# Patient Record
Sex: Male | Born: 1990
Health system: Western US, Academic
[De-identification: ages and names within clinical notes are randomized; demographics above are authoritative.]

## PROBLEM LIST (undated history)

## (undated) DEATH — deceased

---
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
## 2017-05-18 ENCOUNTER — Encounter (HOSPITAL_BASED_OUTPATIENT_CLINIC_OR_DEPARTMENT_OTHER): Payer: Self-pay | Admitting: Sports Medicine

## 2017-05-18 NOTE — Telephone Encounter (Signed)
Open in error

## 2017-06-11 ENCOUNTER — Telehealth (HOSPITAL_BASED_OUTPATIENT_CLINIC_OR_DEPARTMENT_OTHER): Payer: Self-pay

## 2017-06-11 NOTE — Telephone Encounter (Signed)
(  TEXTING IS AN OPTION FOR [facility] CLINICS ONLY)  Is this a [facility] clinic? No      RETURN CALL: Detailed message on voicemail only      SUBJECT:  General Message     REASON FOR REQUEST: Medical Records sent    MESSAGE: patient states that the medical records had been sent over on 6/7 or 6/8 and was checking to make sure that they were received so he could schedule an appt. Please contact patient to advise. Thank you.

## 2017-06-13 NOTE — Telephone Encounter (Signed)
Checked all pending referrals. No referral, records, or imaging received for patient.    Placed outgoing call to [phone]: call rang then dropped. Tried again, went to voicemail after 2 rings.    Left message: Advised patient nothing received yet. Confirmed fax number. Advised we sent records request back in April. Advised records/imaging required for review due to distance traveled. Provided direct phone number twice.    Regards,    Maryella ShiversSarah Masters  Patient Services Specialist  [facility]

## 2017-06-22 ENCOUNTER — Ambulatory Visit: Attending: Sports Medicine | Admitting: Sports Medicine

## 2017-06-22 ENCOUNTER — Encounter (HOSPITAL_BASED_OUTPATIENT_CLINIC_OR_DEPARTMENT_OTHER): Payer: Self-pay | Admitting: Sports Medicine

## 2017-06-22 VITALS — BP 130/82 | HR 64 | Ht 70.3 in | Wt 194.6 lb

## 2017-06-22 DIAGNOSIS — M545 Low back pain, unspecified: Secondary | ICD-10-CM

## 2017-06-22 DIAGNOSIS — Z6827 Body mass index (BMI) 27.0-27.9, adult: Secondary | ICD-10-CM

## 2017-06-22 DIAGNOSIS — G8929 Other chronic pain: Secondary | ICD-10-CM | POA: Insufficient documentation

## 2017-06-22 DIAGNOSIS — M7918 Myalgia, other site: Secondary | ICD-10-CM

## 2017-06-22 MED ORDER — MELOXICAM 7.5 MG OR TABS
7.5000 mg | ORAL_TABLET | Freq: Every morning | ORAL | 1 refills | Status: DC
Start: 2017-06-22 — End: 2017-08-30

## 2017-06-22 NOTE — Patient Instructions (Addendum)
[facility] to [facility] today; Elvera MariaBrian J Solaris Kram MD appreciates the opportunity to work with you, your primary care provider and the rest of your health care team to work to optimize your function and quality of life.    Our goal is to provide easy to understand instructions.    Reminders from your visit today:    Diagnosis:  1. Left rhomboid myofascial pain  2. Right lower back/gluteal pain, piriformis syndrome vs discogenic pain    Recommendations:    Left rhomboid myofascial pain  -massage and acupuncture therapy  -consider trigger point injection with Dr Lenon CurtKrabak, if no improvement  -continue to work on flexibility    Right lower back/gluteal pain, piriformis syndrome vs discogenic pain  -massage therapy  -focus on stretching, consider HOT YOGA  -utilize Meloxicam 7.5 mg once a day, especially one flight days  -consider MRI of the Lumbar Spine  -consider US Guided Piriformis Injection for the gluteal pain    FOLLOW-UP: Schedule an appointment for 8 weeks to review your program    Contact Dr Lenon CurtKrabak through [facility] to review your progress and any images, as appropriate    Have other questions? - Just let us know, we'd be happy to help. Please feel free to call us at [phone] Option 2 or use the "email" feature in [facility].    You may receive a survey in the mail about your experience in our clinic today and about the care by team and I provided for you.  Your feedback is very important to me.  Please take a few moments to complete and return the survey.

## 2017-06-22 NOTE — Progress Notes (Addendum)
[facility] at [facility]  [address] [place], [place]  [address]  TEL: [phone]    FAX: [phone]  [email or web address]    Specializing in sports-related injuries and non-surgical care for conditions of the spine,  shoulder, elbow, wrist, hand, hip, knee, foot, and ankle.        06/22/2017      Randy Duarte  [id number]      REASON FOR VISIT/CONSULTATION:  Leg Pain (Right glute pain radiating to hamstring; began about 1 year ago; pain on the right leg x3 months) and Back Pain (Left upper back pain/out of place; x1 year)      HISTORY OF PRESENT ILLNESS:  Randy Duarte is a 27 year old right-hand dominant male being seen in consultation at the request of Doretha Imus, Leticia Clas for the evaluation of their chief complaint.  About 1-2 years ago, he was flying and started experience the acute onset of left side lateral hip that worsened with internal rotation of left hip. He had some numbness in the left big toe. This pain resolved with PT. About 4 months, he reports the onset of right sided gluteal pain with radiation towards the posterior hamstring especially with prolonged sitting or flying (typically 1-2 hours). He doesn't note numbness in the right foot. The pain is described as a "nerve like pain" that can get up to a 7/10. He notes pain with bending forward. He doesn't report weakness in the leg. Work-up has included x-rays of the back and bone scan. Treatment has included activity modifications and PT. He does report occasional intermittent lower back pain and stiffness with walking long distance or hiking.     The second concern that involves left upper thoracic spine pain that is is achy to sharp in quality. He rates the pain as 7 out of 10. There is no radiation or numbness in the extremity. Symptoms improve with chiropractic therapy. Work-up has included x-rays and bone scan. Treatment has included chiropractic therapy and PT.     PAST MEDICAL  HISTORY:  He  has a past medical history of NEGATIVE PAST MEDICAL HISTORY OF.    PAST SURGICAL HISTORY:   He  has a past surgical history that includes UNLISTED PROCEDURE SHOULDER (Right, 01/2007).    ALLERGIES:   Review of patient's allergies indicates:  Allergies   Allergen Reactions    Ceclor [Cefaclor] Unknown     As a child.       MEDICATIONS:   Current Outpatient Medications   Medication Sig Dispense Refill    Loratadine 10 MG Oral Tab Take by mouth.       No current facility-administered medications for this visit.        FAMILY HISTORY:   Pertinent family history includes Hypertension in his father; No Known Problems in his mother.    SOCIAL HISTORY:  He  reports that he has never smoked. He has never used smokeless tobacco. He reports that he drinks alcohol. He reports that he does not use drugs.    REVIEW OF SYSTEMS:  Except for those mentioned in the HPI, complete review of systems is negative.    PHYSICAL EXAMINATION:  VITAL SIGNS: BP 130/82    Pulse 64    Ht 5' 10.3" (1.786 m)    Wt 194 lb 9.6 oz (88.3 kg)    BMI 27.68 kg/m  Exercise Vitals: Total Minutes of Exercise per Week: (!) 120.  GENERAL: He is Well-Developed and  Well Nourished. They are alert and oriented.  PSYCHOLOGICAL:  No acute distress. Mood euthymic. Normal affect.  HEENT: Normal cephalic, atraumatic. Non-interic sclera. Moist mucous membranes   RESPIRATORY:  Non-labored breathing  CARDIOVASCULAR: No cyanosis, clubbing or edema. Intact peripheral pulses.  SKIN: No atrophy, rashes or open wounds involving the upper and lower extremities. NEUROLOGIC: CN II-XII grossly intact. Strength is grossly 5 out of 5 throughout bilateral upper and lower extremities. Sensation was intact in the bilateral upper and lower extremities.  Reflexes were within normal limits in the bilateral upper and lower extremities. MUSCULOSKELETAL: Inspection: Normal spinal alignment, muscle bulk, and pelvic symmetry. Range of Motion:  Cervical and Lumbar spine: Mildly  decreased in all planes. Hip: internal and external rotation intact bilaterally. Palpation:  There is tenderness to palpation at the left rhomboid and right gluteal regions. Special Tests:  Mildly positive slump test but negative Straight Leg Raise on the affected side. Negative FABERs test bilaterally. Negative impingement signs  Functional: Gait is non-antalgic. Core strength is abnormal.      IMAGING:   1. Xrays of the Thoracic Spine (03/14/2017): Normal  2. Xrays of the Cervical Spine (03/14/2017): Normal  3. Bone Scan (03/20/2017): Reported normal  4. MRI of the Lumbar Spine (10/23/2016): Reported normal in the notes    IMPRESSION:  1. Chronic left posterior thoracic pain, rhomboid myofascial pain  2. Chronic right lower back and gluteal pain, piriformis syndrome vs discitis    DISCUSSION/PLAN:  Left rhomboid myofascial pain  -massage and acupuncture therapy  -consider trigger point injection with Dr Lenon CurtKrabak, if no improvement  -continue to work on flexibility    Right lower back/gluteal pain, piriformis syndrome vs discogenic pain  -massage therapy  -focus on stretching, consider HOT YOGA  -utilize Meloxicam 7.5 mg once a day, especially one flight days  -consider MRI of the Lumbar Spine  -consider US Guided Piriformis Injection for the gluteal pain    FOLLOW-UP: Schedule an appointment for 8 weeks to review your program

## 2017-08-03 ENCOUNTER — Encounter (HOSPITAL_BASED_OUTPATIENT_CLINIC_OR_DEPARTMENT_OTHER): Payer: Self-pay | Admitting: Sports Medicine

## 2017-08-03 ENCOUNTER — Ambulatory Visit: Attending: Sports Medicine | Admitting: Sports Medicine

## 2017-08-03 VITALS — BP 137/79 | HR 65 | Ht 70.32 in | Wt 190.0 lb

## 2017-08-03 DIAGNOSIS — M545 Low back pain, unspecified: Secondary | ICD-10-CM

## 2017-08-03 DIAGNOSIS — G8929 Other chronic pain: Secondary | ICD-10-CM | POA: Insufficient documentation

## 2017-08-03 DIAGNOSIS — Z6827 Body mass index (BMI) 27.0-27.9, adult: Secondary | ICD-10-CM

## 2017-08-03 DIAGNOSIS — M7918 Myalgia, other site: Secondary | ICD-10-CM

## 2017-08-03 NOTE — Patient Instructions (Signed)
Thank you for coming to [facility] today; Randy MariaBrian J Karn Derk MD appreciates the opportunity to work with you, your primary care provider and the rest of your health care team to work to optimize your function and quality of life.    Our goal is to provide easy to understand instructions.    Reminders from your visit today:    Diagnosis:  1. Chronic left posterior thoracic pain, rhomboid myofascial pain  2. Chronic right lower back and gluteal pain, piriformis syndrome    Recommendations:    ACTIVITY: As tolerated    PHYSICAL THERAPY: Continue with PT and dry needling    INJECTION: US guided right piriformis steroid injection    FOLLOW-UP: Schedule for injecton if no improvement      Contact Dr Lenon CurtKrabak through e[facility] to review your progress and any images, as appropriate    Have other questions? - Just let us know, we'd be happy to help. Please feel free to call us at [phone] Option 2 or use the "email" feature in [facility].    You may receive a survey in the mail about your experience in our clinic today and about the care by team and I provided for you.  Your feedback is very important to me.  Please take a few moments to complete and return the survey.

## 2017-08-03 NOTE — Progress Notes (Signed)
[facility]  [address] [place], [place]  [address]  TEL: [phone]    FAX: [phone]  [email or web address]    Specializing in sports-related injuries and non-surgical care for conditions of the spine,  shoulder, elbow, wrist, hand, hip, knee, foot, and ankle.      REASON FOR VISIT/CONSULTATION:  Follow-Up  and Back Pain ("Chronic right-sided low back pain without sciatica"; Per last office visit note, consider Trigger Point Injections for rhomboid pain; Consider MRI lumbar spine versus ultrasound guided piriformis injection; Better when patient doesn't exercise, especialy with lower bosy; Hot yoga, stretches, and a couple of runs have been done; Recent plane trip has worsened pain in right-sided lower back; GOAL: "Expand what he thinks it is".)      HISTORY OF PRESENT ILLNESS (06/22/2017):  Randy Duarte is a 27 year old right-hand dominant male being seen in consultation at the request of Doretha Imus Leticia Clas for the evaluation of their chief complaint.  About 1-2 years ago, he was flying and started experience the acute onset of left side lateral hip that worsened with internal rotation of left hip. He had some numbness in the left big toe. This pain resolved with PT. About 4 months, he reports the onset of right sided gluteal pain with radiation towards the posterior hamstring especially with prolonged sitting or flying (typically 1-2 hours). He doesn't note numbness in the right foot. The pain is described as a "nerve like pain" that can get up to a 7/10. He notes pain with bending forward. He doesn't report weakness in the leg. Work-up has included x-rays of the back and bone scan. Treatment has included activity modifications and PT. He does report occasional intermittent lower back pain and stiffness with walking long distance or hiking.     The second concern that involves left upper thoracic spine pain that is is achy to sharp in  quality. He rates the pain as 7 out of 10. There is no radiation or numbness in the extremity. Symptoms improve with chiropractic therapy. Work-up has included x-rays and bone scan. Treatment has included chiropractic therapy and PT.     INTERVAL HISTORY:   Since his last examination, the patient reports slight improvement in his symptoms.  He still has intermittent pain involving the right-sided lower back with radiation to the thigh and rhomboid region.  Pain is described as intermittent and aching quality.  He rates the pain as a 3 out of 10 though it can go up to a 5 out of 10.  Symptoms are worse with prolonged sitting or prolonged stretching.  Symptoms improve with walking and standing.  He has received some dry needling to the two areas of pain.     PAST MEDICAL HISTORY:  He  has a past medical history of NEGATIVE PAST MEDICAL HISTORY OF.    PAST SURGICAL HISTORY:   He  has a past surgical history that includes UNLISTED PROCEDURE SHOULDER (Right, 01/2007).    ALLERGIES:   Review of patient's allergies indicates:  Allergies   Allergen Reactions    Ceclor [Cefaclor] Unknown     As a child.       MEDICATIONS:   Current Outpatient Medications   Medication Sig Dispense Refill    Loratadine 10 MG Oral Tab Take by mouth.      meloxicam 7.5 MG Oral Tab Take 1 tablet (7.5 mg) by mouth every morning. (Patient not taking: Reported on 08/03/2017) 30  tablet 1     No current facility-administered medications for this visit.        FAMILY HISTORY:   Pertinent family history includes Hypertension in his father; No Known Problems in his mother.    SOCIAL HISTORY:  He  reports that he has never smoked. He has never used smokeless tobacco. He reports that he drinks alcohol. He reports that he does not use drugs.    REVIEW OF SYSTEMS:  Except for those mentioned in the HPI, complete review of systems is negative.    PHYSICAL EXAMINATION:  VITAL SIGNS: BP 137/79    Pulse 65    Ht 5' 10.32" (1.786 m)    Wt 190 lb (86.2 kg)     SpO2 98%    BMI 27.02 kg/m  Exercise Vitals: Total Minutes of Exercise per Week: 250.  GENERAL: He is Well-Developed and Well Nourished. They are alert and oriented.  PSYCHOLOGICAL:  No acute distress. Mood euthymic. Normal affect.  HEENT: Normal cephalic, atraumatic. Non-interic sclera. Moist mucous membranes   RESPIRATORY:  Non-labored breathing  CARDIOVASCULAR: No cyanosis, clubbing or edema. Intact peripheral pulses.  SKIN: No atrophy, rashes or open wounds involving the upper and lower extremities. MUSCULOSKELETAL: Inspection: Normal spinal alignment, muscle bulk, and pelvic symmetry. Palpation:  There is tenderness to palpation at the left rhomboid and right gluteal regions. Special Tests:  Mildly positive slump test but negative Straight Leg Raise on the affected side.    IMAGING:   1. Xrays of the Thoracic Spine (03/14/2017): Normal  2. Xrays of the Cervical Spine (03/14/2017): Normal  3. Bone Scan (03/20/2017): Reported normal  4. MRI of the Lumbar Spine (10/23/2016): Reported normal in the notes    IMPRESSION:  1. Chronic left posterior thoracic pain, rhomboid myofascial pain  2. Chronic right lower back and gluteal pain, piriformis syndrome vs discitis    DISCUSSION/PLAN:  He is starting to experience a lot of improvement.  I'm recommending continued trial of dry needling and physical therapy.  If symptoms do not improve then I'll consider ultrasound guided piriformis steroid injection to the right gluteal region and /or trigger point injection to left rhomboid.    FOLLOW-UP: Schedule an appointment for 4-6 weeks

## 2017-08-28 ENCOUNTER — Other Ambulatory Visit (HOSPITAL_BASED_OUTPATIENT_CLINIC_OR_DEPARTMENT_OTHER): Admitting: Sports Medicine

## 2017-08-30 ENCOUNTER — Encounter (HOSPITAL_BASED_OUTPATIENT_CLINIC_OR_DEPARTMENT_OTHER): Payer: Self-pay | Admitting: Sports Medicine

## 2017-08-30 ENCOUNTER — Ambulatory Visit: Attending: Sports Medicine | Admitting: Sports Medicine

## 2017-08-30 VITALS — BP 118/78 | HR 62 | Resp 16 | Ht 69.33 in | Wt 192.2 lb

## 2017-08-30 DIAGNOSIS — S29012D Strain of muscle and tendon of back wall of thorax, subsequent encounter: Secondary | ICD-10-CM | POA: Insufficient documentation

## 2017-08-30 DIAGNOSIS — Z6828 Body mass index (BMI) 28.0-28.9, adult: Secondary | ICD-10-CM

## 2017-08-30 MED ORDER — TRIAMCINOLONE ACETONIDE 40 MG/ML IJ SUSP
40.0000 mg | Freq: Once | INTRAMUSCULAR | Status: AC
Start: 2017-08-30 — End: 2017-08-30
  Administered 2017-08-30: 40 mg

## 2017-08-30 NOTE — Addendum Note (Signed)
Addended by: Berkley HarveyKRABAK, Kenetra Hildenbrand JOSEPH on: 08/30/2017 11:46 AM     Modules accepted: Orders

## 2017-08-30 NOTE — Progress Notes (Signed)
[facility] at [facility]  [address] [place], [place]  [address]  TEL: [phone]    FAX: [phone]  [email or web address]    Specializing in sports-related injuries and non-surgical care for conditions of the spine,  shoulder, elbow, wrist, hand, hip, knee, foot, and ankle.      REASON FOR VISIT/CONSULTATION:  Procedure (US guided Corticosteroid Injection of Right Piriformis)      HISTORY OF PRESENT ILLNESS (06/22/2017):  Randy Duarte is a 27 year old right-hand dominant male being seen in consultation at the request of Doretha Imus Leticia Clas for the evaluation of their chief complaint.  About 1-2 years ago, he was flying and started to experience the acute onset of left side lateral hip that worsened with internal rotation of left hip. He had some numbness in the left big toe. This pain resolved with PT. About 4 months, he reports the onset of right sided gluteal pain with radiation towards the posterior hamstring especially with prolonged sitting or flying (typically 1-2 hours). He doesn't note numbness in the right foot. The pain is described as a "nerve like pain" that can get up to a 7/10. He notes pain with bending forward. He doesn't report weakness in the leg. Work-up has included x-rays of the back and bone scan. Treatment has included activity modifications and PT. He does report occasional intermittent lower back pain and stiffness with walking long distance or hiking.     The second concern that involves left upper thoracic spine pain that is is achy to sharp in quality. He rates the pain as 7 out of 10. There is no radiation or numbness in the extremity. Symptoms improve with chiropractic therapy. Work-up has included x-rays and bone scan. Treatment has included chiropractic therapy and PT.     INTERVAL HISTORY:   Since his last examination, the patient reports slight improvement in his symptoms.  He still has intermittent pain involving the  left rhomboid region.  Pain is described as intermittent and aching quality.  He rates the pain as a 2 out of 10. Symptoms are worse with prolonged sitting or prolonged stretching.  Symptoms improve chiropractic therapy and massage therapy.  He has been undergoing dry needle therapy to his physical therapist with slight improvement in symptoms.     He continues to have mild right gluteal symptoms that are achy to sharp in quality.  The symptoms appear to be improving and he would like to wait on any injection.  PAST MEDICAL HISTORY:  He  has a past medical history of NEGATIVE PAST MEDICAL HISTORY OF.    PAST SURGICAL HISTORY:   He  has a past surgical history that includes UNLISTED PROCEDURE SHOULDER (Right, 01/2007).    ALLERGIES:   Review of patient's allergies indicates:  Allergies   Allergen Reactions    Ceclor [Cefaclor] Unknown     As a child.       MEDICATIONS:   Current Outpatient Medications   Medication Sig Dispense Refill    Loratadine 10 MG Oral Tab Take by mouth.       No current facility-administered medications for this visit.        FAMILY HISTORY:   Pertinent family history includes Hypertension in his father; No Known Problems in his mother.    SOCIAL HISTORY:  He  reports that he has never smoked. He has never used smokeless tobacco. He reports that he drinks alcohol. He reports that he does not  use drugs.    REVIEW OF SYSTEMS:  Except for those mentioned in the HPI, complete review of systems is negative.    PHYSICAL EXAMINATION:  VITAL SIGNS: BP 118/78    Pulse 62    Resp 16    Ht 5' 9.33" (1.761 m)    Wt 192 lb 3.2 oz (87.2 kg)    SpO2 99%    BMI 28.11 kg/m  Exercise Vitals: Total Minutes of Exercise per Week: 180.  GENERAL: He is Well-Developed and Well Nourished. They are alert and oriented.  PSYCHOLOGICAL:  No acute distress. Mood euthymic. Normal affect.  HEENT: Normal cephalic, atraumatic. Non-interic sclera. Moist mucous membranes   RESPIRATORY:  Non-labored breathing  CARDIOVASCULAR:  No cyanosis, clubbing or edema. Intact peripheral pulses.  SKIN: No atrophy, rashes or open wounds involving the upper and lower extremities. MUSCULOSKELETAL: Inspection: Normal spinal alignment, muscle bulk, and pelvic symmetry. Palpation:  There is tenderness to palpation at the left rhomboid and right gluteal regions. Special Tests:  Mildly positive slump test but negative Straight Leg Raise on the affected side.    IMAGING:   1. Xrays of the Thoracic Spine (03/14/2017): Normal  2. Xrays of the Cervical Spine (03/14/2017): Normal  3. Bone Scan (03/20/2017): Reported normal  4. MRI of the Lumbar Spine (10/23/2016): Reported normal in the notes    IMPRESSION:  1. Chronic left posterior thoracic pain, rhomboid myofascial pain, not improved  2. Chronic right lower back and gluteal pain, piriformis syndrome    DISCUSSION/PLAN:  He is mainly concerned about the continued left posterior thoracic, rhomboid myofascial pain.  He would like to proceed with a trigger point injection to this area.  Treatment is as follows :    LEFT RHOMBOID TRIGGER POINT INJECTION: Verbal informed consent was obtained, discussing the risks (including pain, bleeding, infection, fat necrosis, skin pigmentation), benefits, and alternatives. A brief pause occurred prior to procedure in which final verification was performed.  3 areas of maximal tenderness consistent with the trigger point were identified in the left rhomboid.  The skin was prepped and draped in the usual sterile fashion using sterile cleanser.  Utilizing a 25 gauge needle with a mixture of 1 cc of TRIAMCINOLONE ACETONIDE 40 MG/ML SUSP (KENALOG) and 4 cc of 0.25% bupivacaine, a total of 1.00 cc was injected into each trigger point with a noted twitch response after clear aspirate.  Postinjection the area was cleaned and a bandage was placed over the injection site. The patient tolerated the procedure well. Complications: none.  The patient was counseled regarding icing and rest over  the next 2-4 days. The patient was counseled to monitor for any redness, warmth, swelling or worsening pain over the next 24-48 hours.    FOLLOW-UP: He was instructed in stretching exercises.  He will monitor symptoms over the next several weeks.  He may return for ultrasound guided right piriformis injection if his gluteal symptoms worsen or plateau.

## 2019-11-10 IMAGING — MR MRI KNEE RT WO CONTRAST
4 of 5 series · 28 of 40 positions shown · non-contrast
Comparison: None.

INDICATION: Pain in right knee
TECHNIQUE: Multiplanar, multiecho imaging of the right knee was performed, including T1-weighted and fluid sensitive sequences without intravenous contrast administration.

[Series 5: t2_axial_fs · axial · right · 3.0mm · 0.42mm/px · z∈[-56,+51]mm · 8 of 28 slices shown]
[im 1/28]
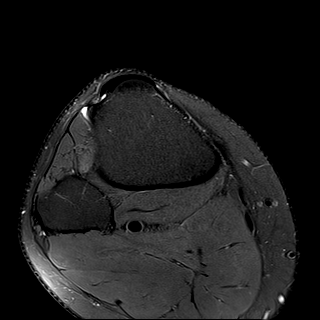
[im 4/28]
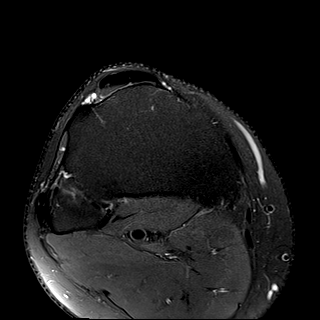
[im 8/28]
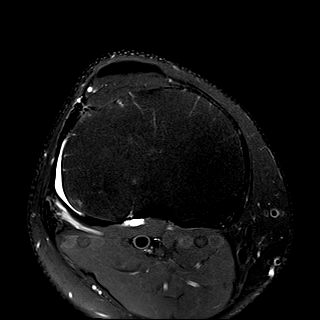
[im 12/28]
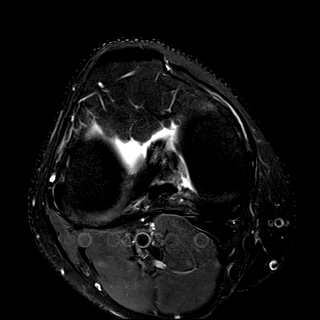
[im 16/28]
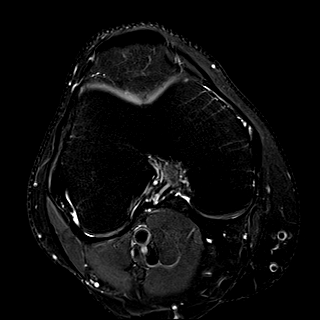
[im 20/28]
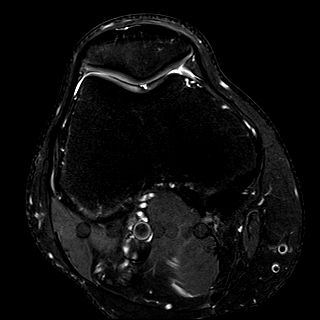
[im 24/28]
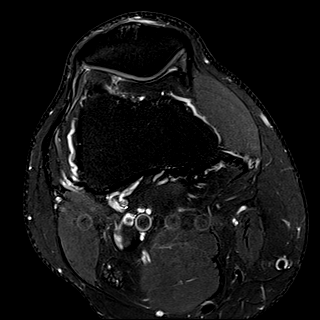
[im 28/28]
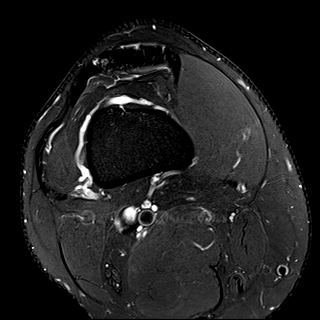

[Series 7: t1_cor · coronal · right · 3.5mm · 0.35mm/px · 7 of 25 slices shown]
[im 1/25]
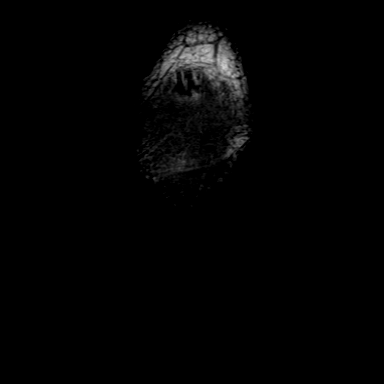
[im 5/25]
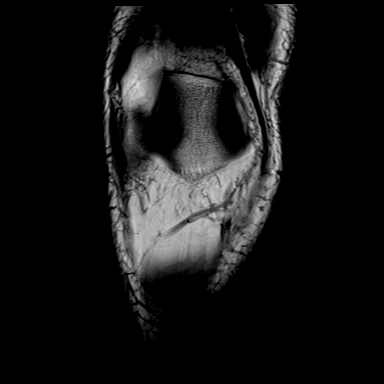
[im 9/25]
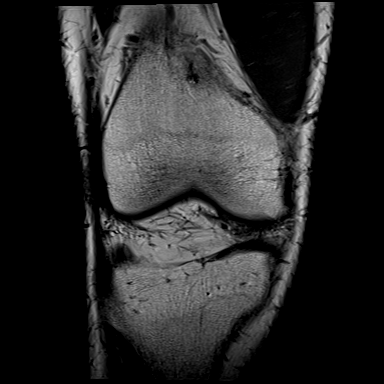
[im 13/25]
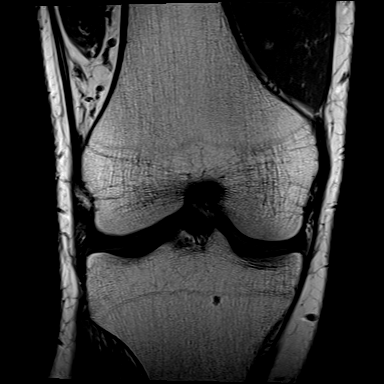
[im 17/25]
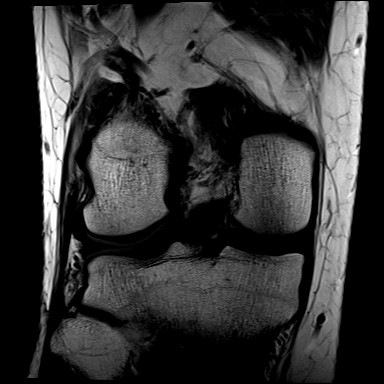
[im 21/25]
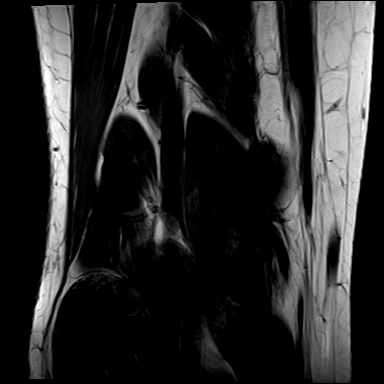
[im 25/25]
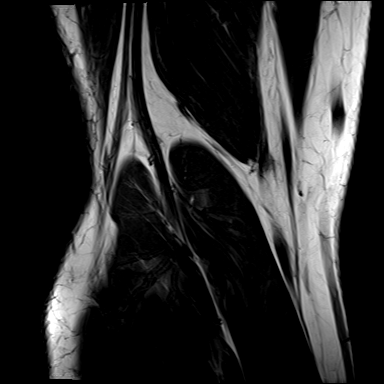

[Series 8: t2_cor_fs · coronal · right · 3.5mm · 0.42mm/px · 7 of 25 slices shown]
[im 1/25]
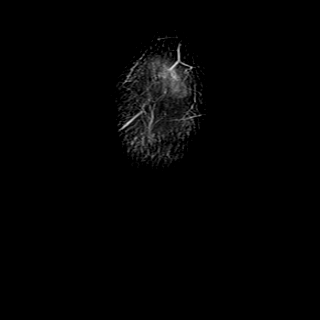
[im 5/25]
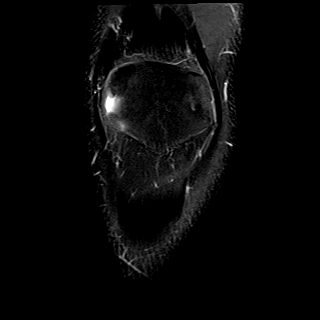
[im 9/25]
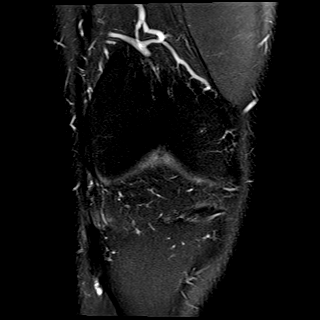
[im 13/25]
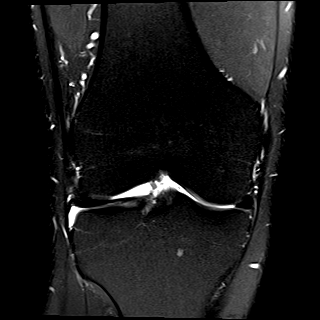
[im 17/25]
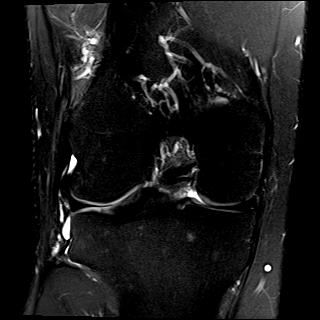
[im 21/25]
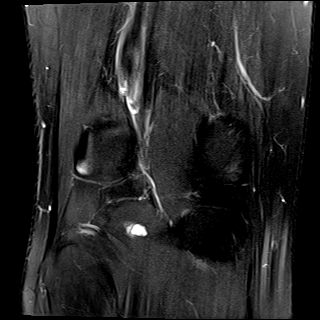
[im 25/25]
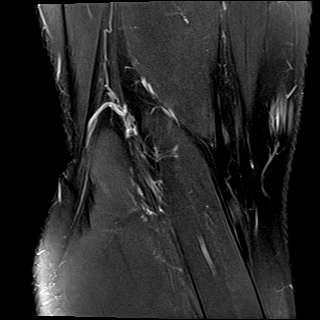

[Series 100: pd_sag_fs · sagittal · right · 3.0mm · 0.21mm/px · 6 of 29 slices shown]
[im 1/29]
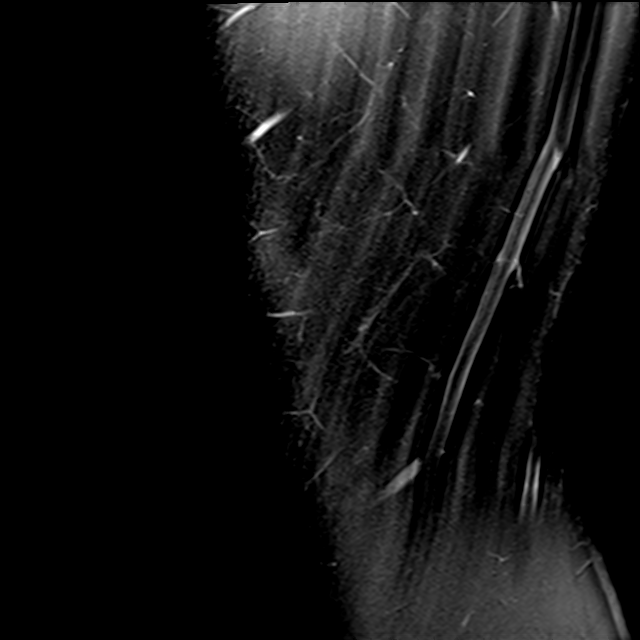
[im 4/29]
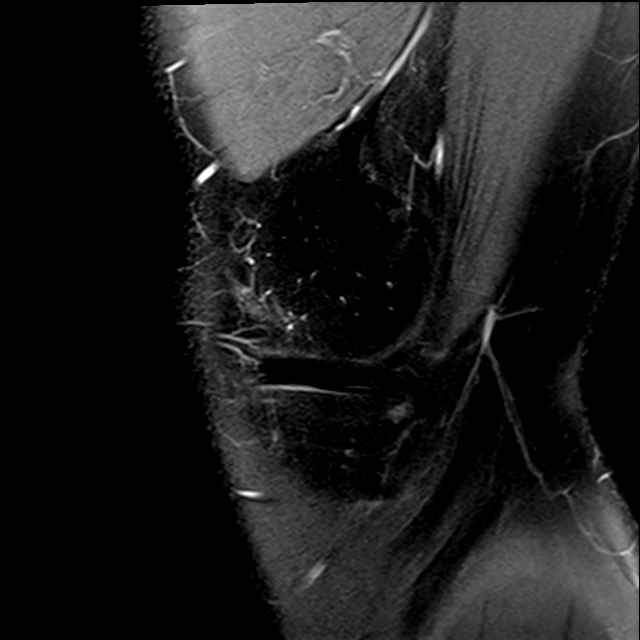
[im 8/29]
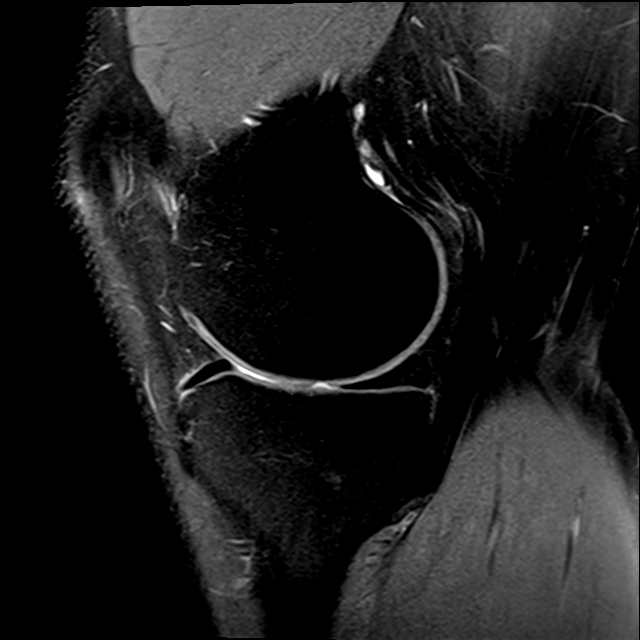
[im 11/29]
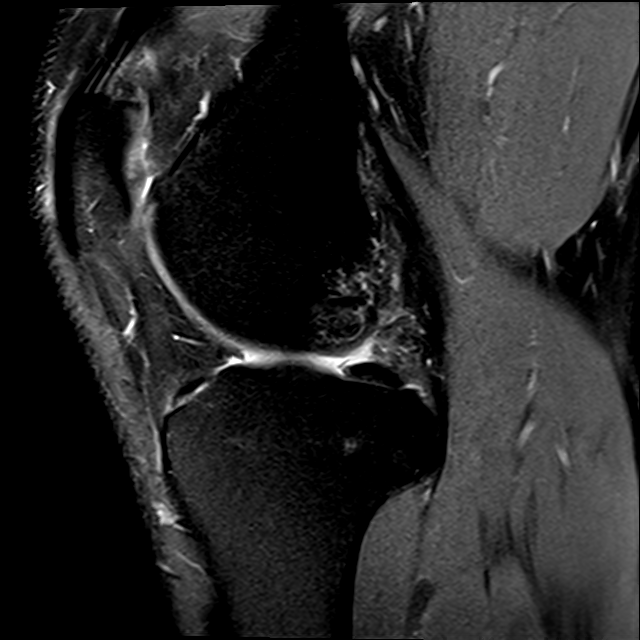
[im 15/29]
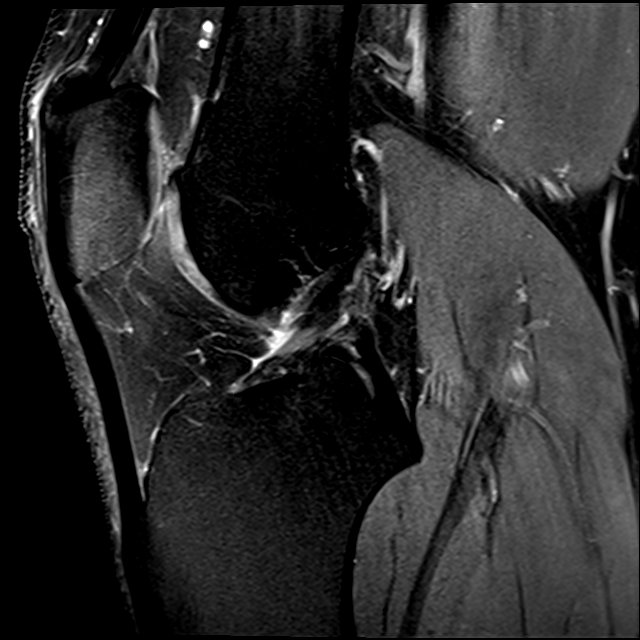
[im 25/29]
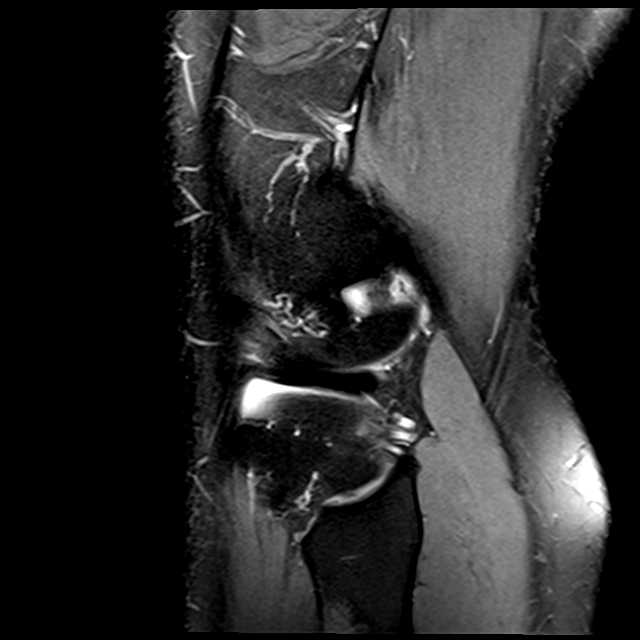

[28 of 40 positions shown; findings below may reference images not displayed]

FINDINGS: MEDIAL MENISCUS:  Linear intrasubstance degeneration of the posterior horn, which does not touch an articular surface. No definite tear.

LATERAL MENISCUS:  Intact.

ACL:  Intact.

PCL:  Intact.

MCL:  Intact.

LATERAL LIGAMENTS AND TENDONS:  Intact.

EXTENSOR MECHANISM:  The quadriceps and patellar tendons are intact.

FAT PADS:   Normal.

CARTILAGE:  

Patellofemoral compartment:  Low-grade partial-thickness chondral fissure at the medial patellar facet. Small low-grade partial-thickness chondral defect at the central femoral trochlea. 

Medial compartment: Low-grade partial-thickness chondral defect at the inner aspect of the weightbearing femoral condyle with adjacent chondral surface irregularity.

Lateral compartment: Intact.

BONE MARROW: Normal.

No joint effusion. No Baker's cyst.
IMPRESSION: 1. Linear intrasubstance degeneration at the posterior horn medial meniscus. No definitive tear.

2. Mild patellofemoral and medial compartment chondral abnormalities.

## 2021-02-02 IMAGING — MR MRI ANKLE RT WO CONTRAST
6 series · 40 of 40 positions shown · non-contrast
Comparison: None

HISTORY: Pain of right ankle joint. Per patient, chronic right ankle pain.
TECHNIQUE: Multiplanar and multisequence MR imaging of the right ankle was performed without the administration of intravenous contrast.

[Series 3: t1_axial · axial · 4.0mm · 0.50mm/px · z∈[-93,+25]mm · 7 of 28 slices shown]
[im 1/28]
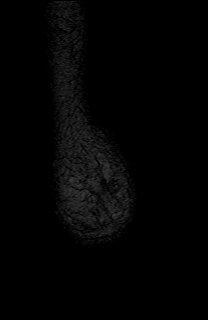
[im 5/28]
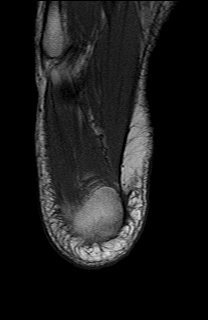
[im 10/28]
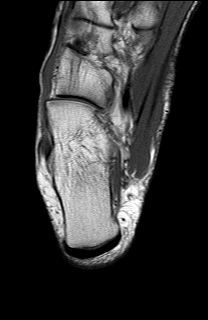
[im 14/28]
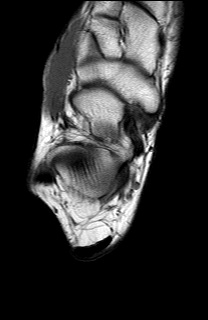
[im 19/28]
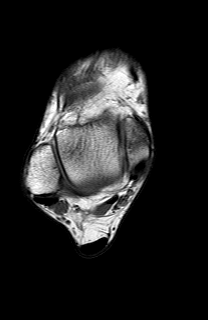
[im 23/28]
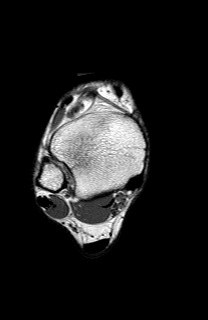
[im 28/28]
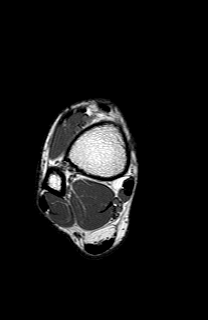

[Series 4: t2_axial_fs · axial · 4.0mm · 0.62mm/px · z∈[-93,+25]mm · 7 of 28 slices shown]
[im 1/28]
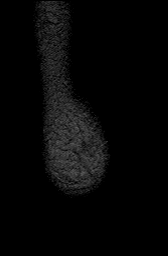
[im 5/28]
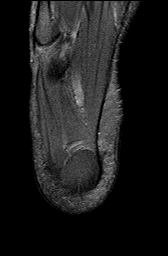
[im 10/28]
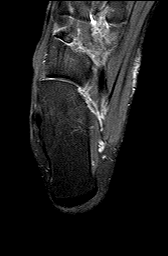
[im 14/28]
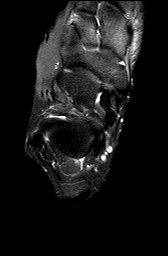
[im 19/28]
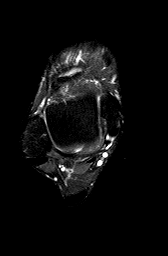
[im 23/28]
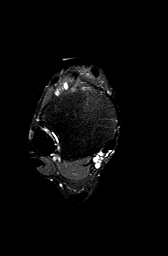
[im 28/28]
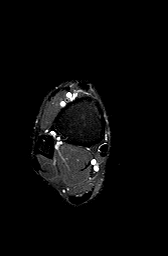

[Series 5: t2_sag_fs · sagittal · 4.0mm · 0.50mm/px · 5 of 22 slices shown]
[im 1/22]
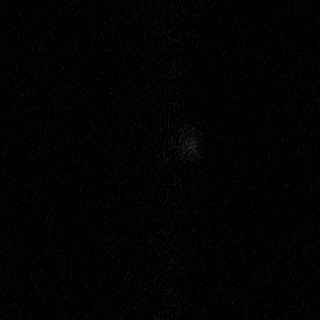
[im 6/22]
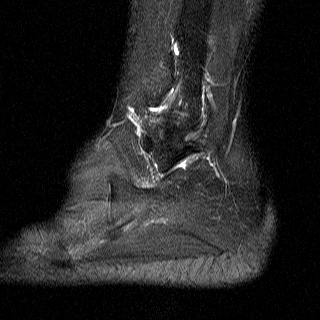
[im 11/22]
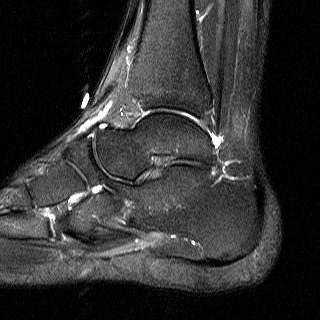
[im 16/22]
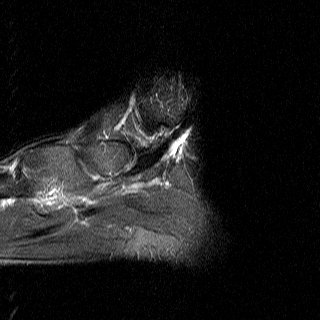
[im 22/22]
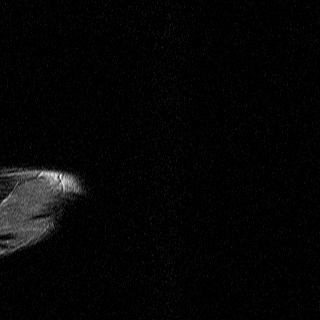

[Series 6: t1_sag · sagittal · 4.0mm · 0.50mm/px · 5 of 22 slices shown]
[im 1/22]
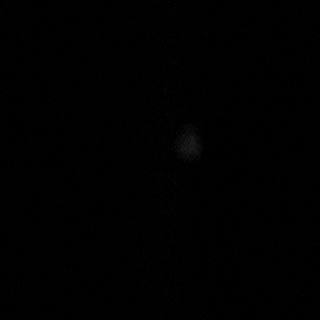
[im 6/22]
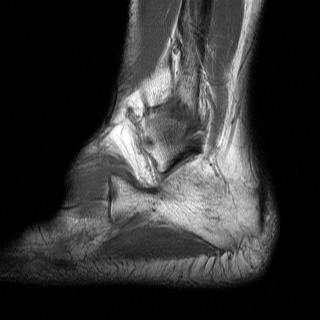
[im 11/22]
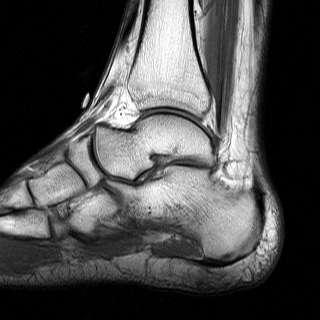
[im 16/22]
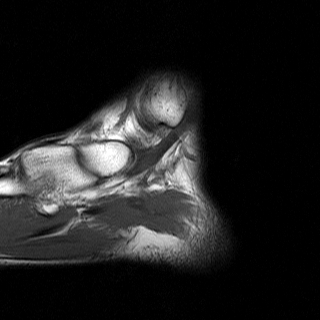
[im 22/22]
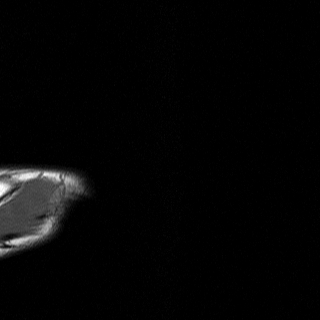

[Series 7: t2_cor_fs · coronal · 4.0mm · 0.50mm/px · 6 of 28 slices shown]
[im 1/28]
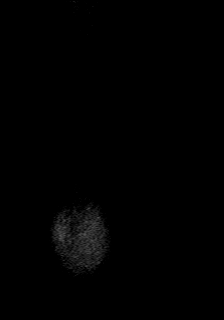
[im 6/28]
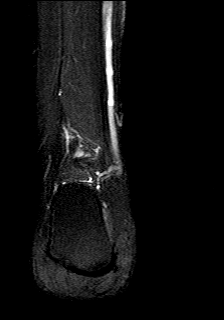
[im 11/28]
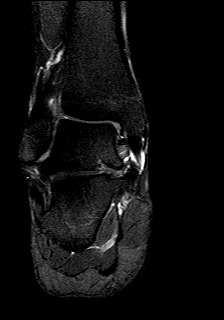
[im 17/28]
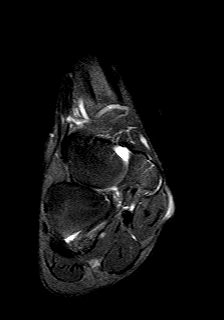
[im 22/28]
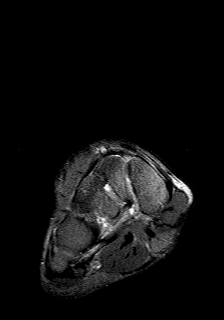
[im 28/28]
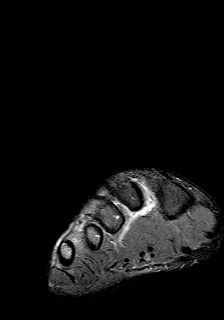

[Series 11: pd_axial_obl_fs · axial · 4.0mm · 0.62mm/px · z∈[-11,+44]mm · 10 of 45 slices shown]
[im 1/45]
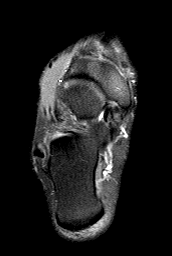
[im 5/45]
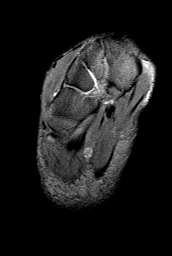
[im 10/45]
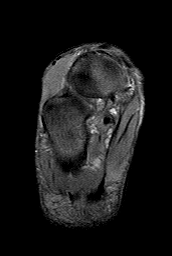
[im 15/45]
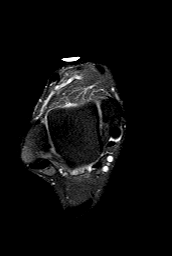
[im 20/45]
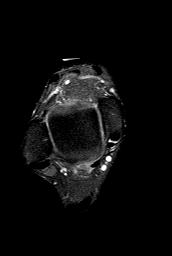
[im 25/45]
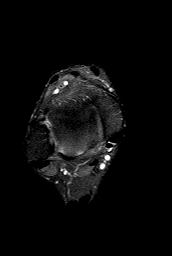
[im 30/45]
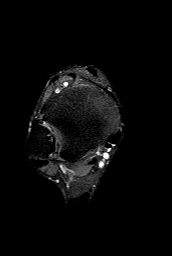
[im 35/45]
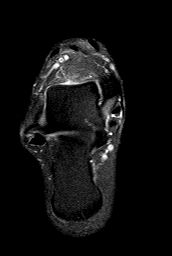
[im 40/45]
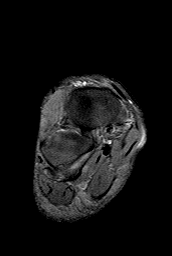
[im 45/45]
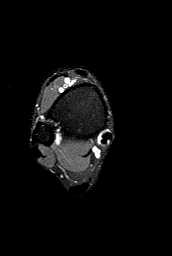

[40 of 40 positions shown; findings below may reference images not displayed]

FINDINGS: Achilles tendon maintains normal biconcave shape and normal signal intensity.

Mild tendinosis and short segment longitudinal tear of peroneus brevis after its exit from the retromalleolar groove. Peroneus longus intact.

Medial flexor tendons are intact.

Extensor tendons are normal.

The anterior and posterior syndesmotic ligaments and anterior and posterior talofibular ligaments are unremarkable. The calcaneofibular ligament is intact. The deltoid ligament is also intact. 

The plantar fascia is unremarkable. 

Subtalar space demonstrates no significant fluid or scar.

The talar dome appears normal. The articular cartilage of the tibiotalar joint is intact. There is no osteochondral lesion. There is no tibiotalar joint effusion.

Visualized osseous structures demonstrate no fractures or destructive lesions.

Signal from muscle normal. Muscle mass normal. No cystic or solid lesions. No visible adenopathy.
IMPRESSION: Mild tendinosis and short segment longitudinal tear peroneus brevis after its exit from the retromalleolar groove. Otherwise unremarkable.

## 2021-02-02 IMAGING — MR MRI KNEE RT WO CONTRAST
5 series · 40 of 40 positions shown · IV contrast (gadolinium)
Comparison: None

HISTORY: Internal derangement of right knee. Per patient, chronic right knee pain, no specific injury. No surgery.
TECHNIQUE: Multiplanar and multisequence MR imaging of the right knee was performed without the administration of intravenous gadolinium.

[Series 3: t2_axial_fs · axial · 4.0mm · 0.62mm/px · z∈[-95,+50]mm · 8 of 30 slices shown]
[im 1/30]
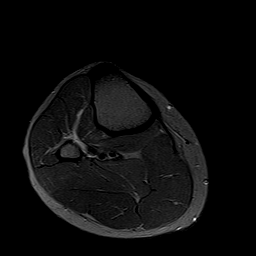
[im 5/30]
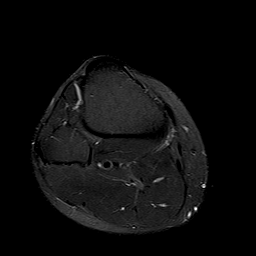
[im 9/30]
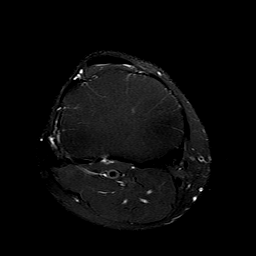
[im 13/30]
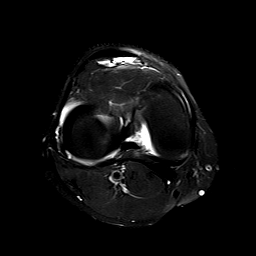
[im 17/30]
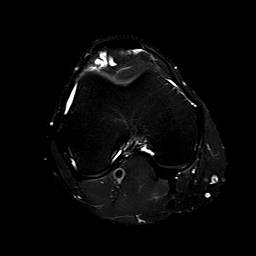
[im 21/30]
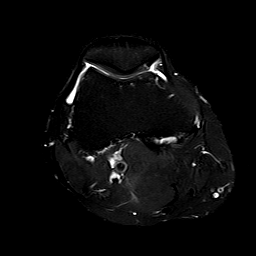
[im 25/30]
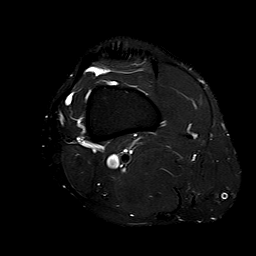
[im 30/30]
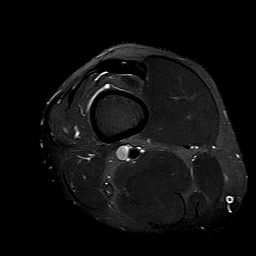

[Series 5: pd_sag_fs · sagittal · 3.0mm · 0.61mm/px · 9 of 28 slices shown]
[im 1/28]
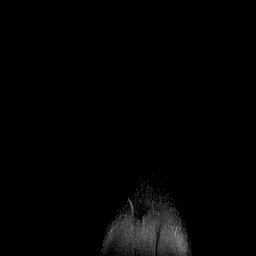
[im 4/28]
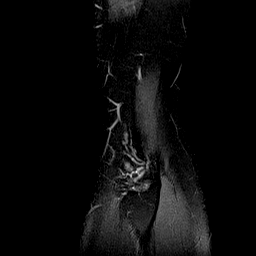
[im 7/28]
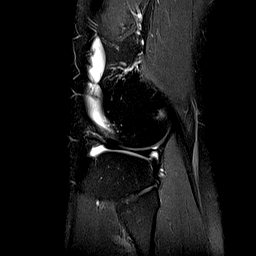
[im 11/28]
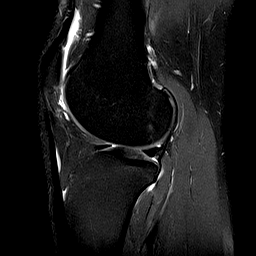
[im 14/28]
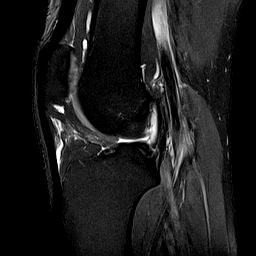
[im 17/28]
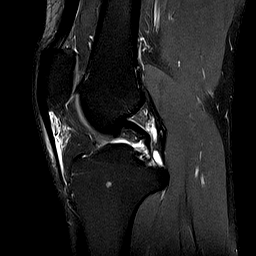
[im 21/28]
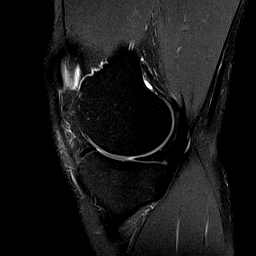
[im 24/28]
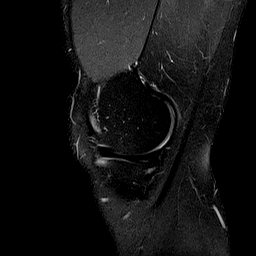
[im 28/28]
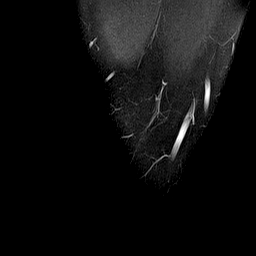

[Series 6: t2_sag_fs · sagittal · 3.0mm · 0.61mm/px · 9 of 28 slices shown]
[im 1/28]
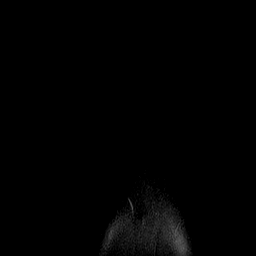
[im 4/28]
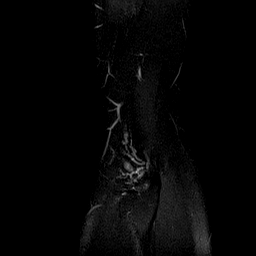
[im 7/28]
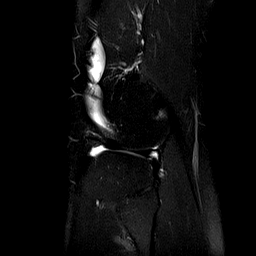
[im 11/28]
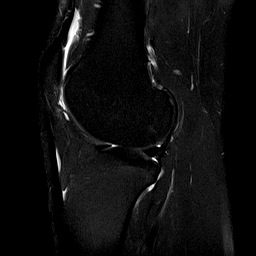
[im 14/28]
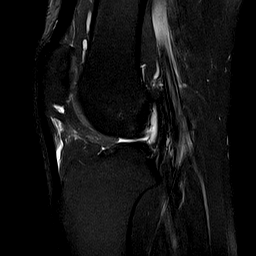
[im 17/28]
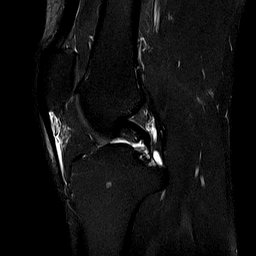
[im 21/28]
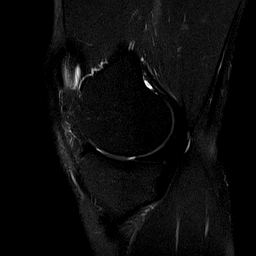
[im 24/28]
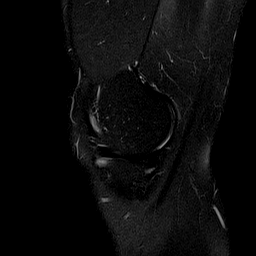
[im 28/28]
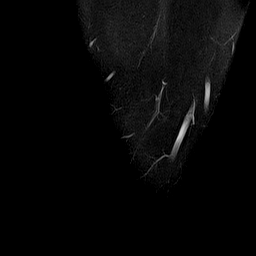

[Series 7: t1_cor · coronal · 4.0mm · 0.50mm/px · 7 of 22 slices shown]
[im 1/22]
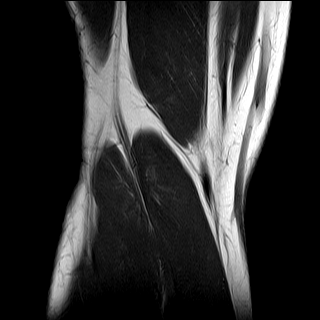
[im 4/22]
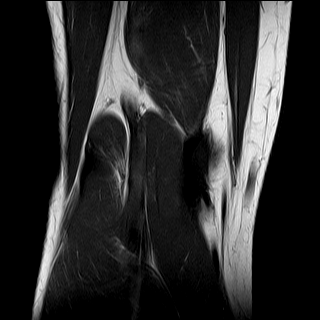
[im 8/22]
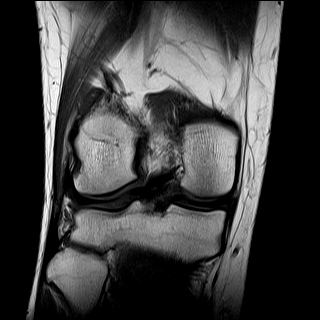
[im 11/22]
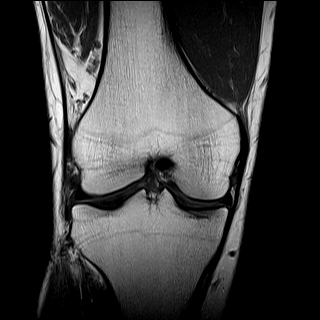
[im 15/22]
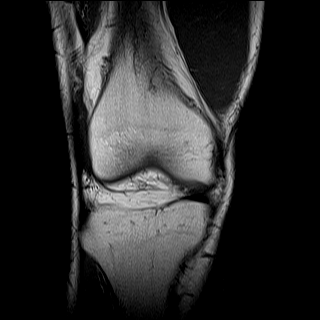
[im 18/22]
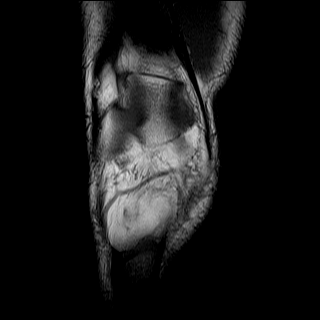
[im 22/22]
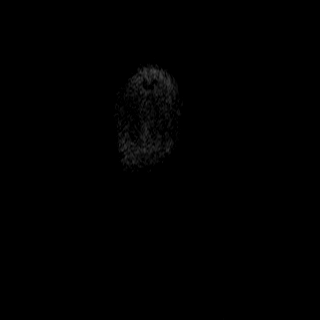

[Series 8: t2_cor_fs · coronal · 4.0mm · 0.50mm/px · 7 of 22 slices shown]
[im 1/22]
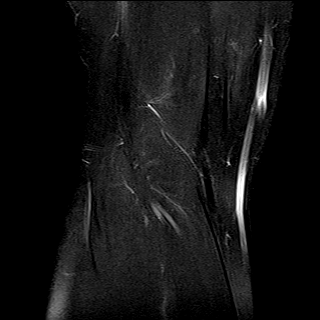
[im 4/22]
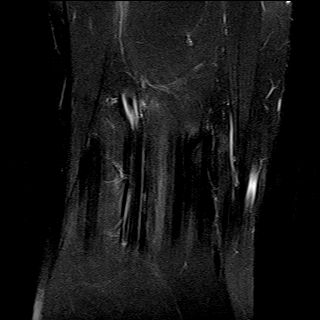
[im 8/22]
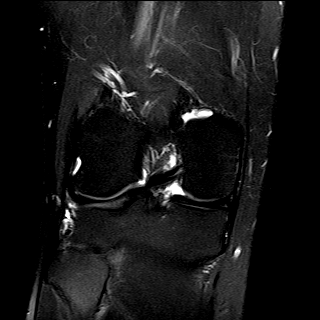
[im 11/22]
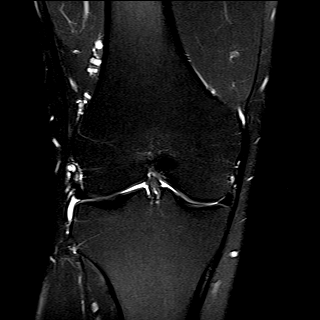
[im 15/22]
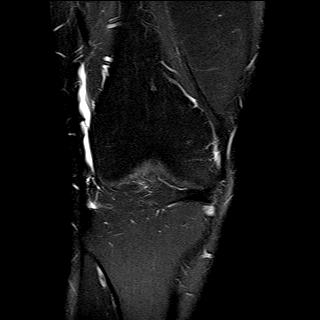
[im 18/22]
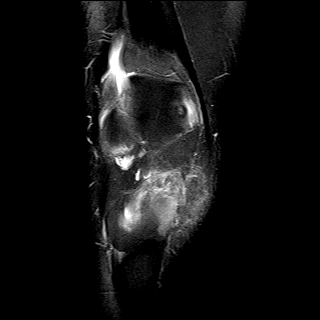
[im 22/22]
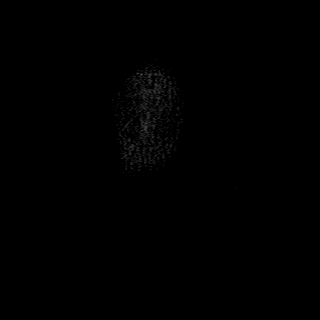

[40 of 40 positions shown; findings below may reference images not displayed]

FINDINGS: Cruciate and collateral ligaments intact.

Horizontal tear posterior horn medial meniscus exiting the inferior articular surface. Lateral meniscus intact.

Small effusion.

Mild chondral surface irregularity medial compartment and patellofemoral compartment.

Osseous structures demonstrate no fractures or destructive lesions.

Extensor mechanism intact. Patellar retinacula are intact. No muscle atrophy. No denervation edema.

Multiseptated serpiginous appearing intra-articular ganglion cyst present within Hoffa's fat. Other etiologies are less likely. Correlate with postcontrast portion of the study to exclude any abnormal enhancement.
IMPRESSION: 1.
Multiseptated serpiginous appearing intra-articular ganglion cyst present within Hoffa's fat. Other etiologies are less likely. Correlate with postcontrast portion of the study to exclude any abnormal enhancement.

2.
Horizontal tear posterior horn medial meniscus exiting the inferior articular surface.

3.
Small effusion.

4.
Mild chondral surface irregularity medial compartment and patellofemoral compartment.

## 2021-11-18 IMAGING — MR MRI KNEE RT WO CONTRAST
5 of 6 series · 33 of 40 positions shown · non-contrast
Comparison: MRI of the right knee, 02/02/2021.

Images Obtained from [facility]
INDICATION: pain in right knee
TECHNIQUE: Multiplanar, multisequence MR imaging of the right knee was performed without contrast.

[Series 2: t2_axial_fs · axial · 4.0mm · 0.44mm/px · z∈[-86,+29]mm · 7 of 24 slices shown]
[im 1/24]
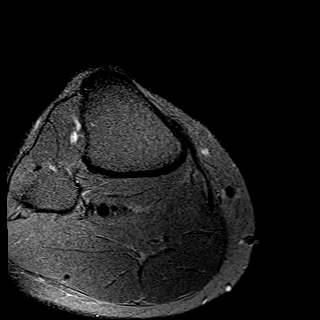
[im 4/24]
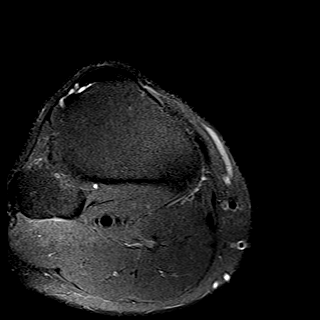
[im 8/24]
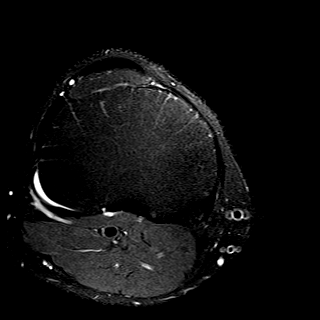
[im 12/24]
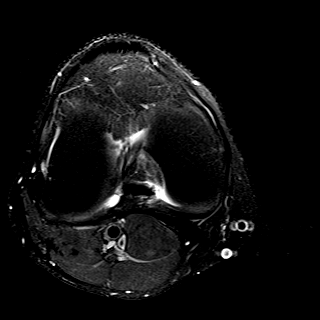
[im 16/24]
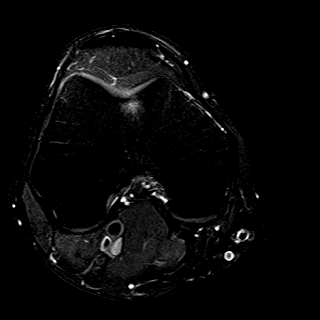
[im 20/24]
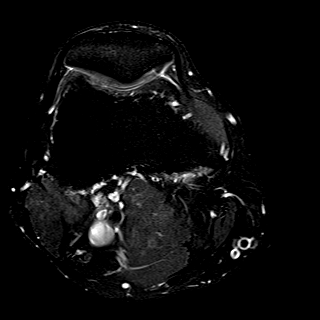
[im 24/24]
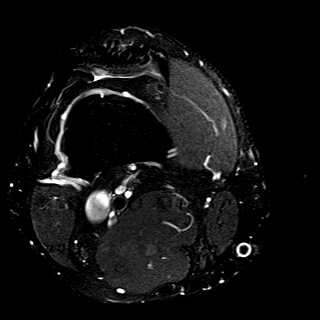

[Series 4: pd_sag_fs · sagittal · 3.0mm · 0.57mm/px · 7 of 25 slices shown]
[im 1/25]
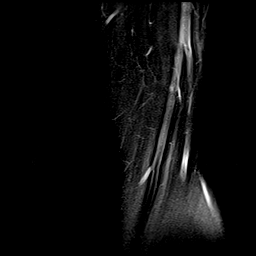
[im 5/25]
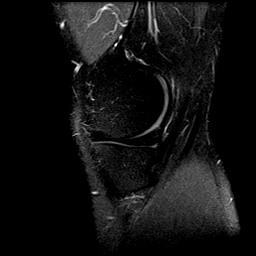
[im 9/25]
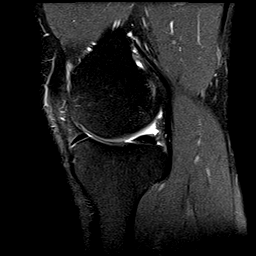
[im 13/25]
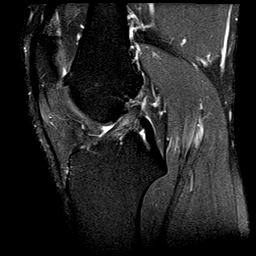
[im 17/25]
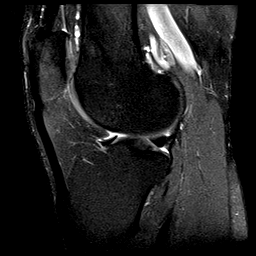
[im 21/25]
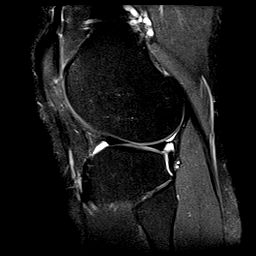
[im 25/25]
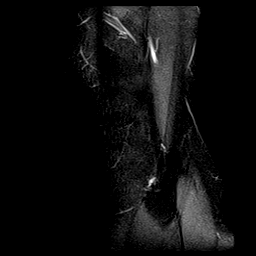

[Series 5: t2_sag_fs · sagittal · 3.0mm · 0.57mm/px · 7 of 25 slices shown]
[im 1/25]
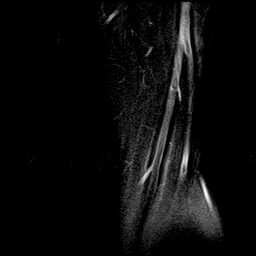
[im 5/25]
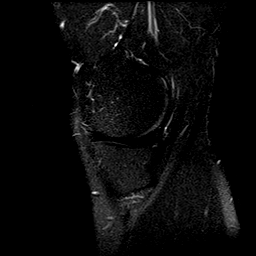
[im 9/25]
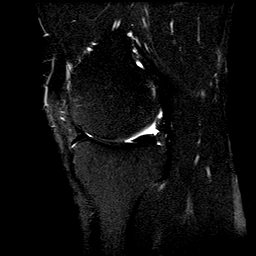
[im 13/25]
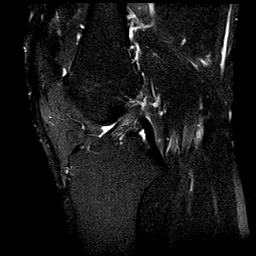
[im 17/25]
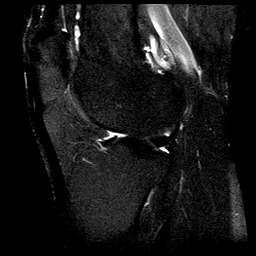
[im 21/25]
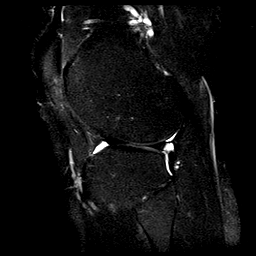
[im 25/25]
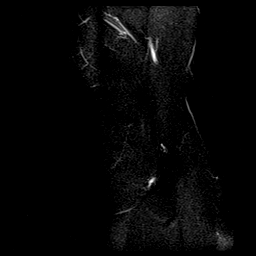

[Series 6: t1_cor · coronal · 4.0mm · 0.44mm/px · 6 of 22 slices shown]
[im 1/22]
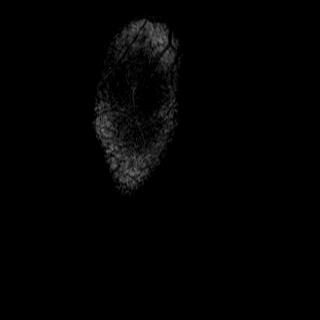
[im 5/22]
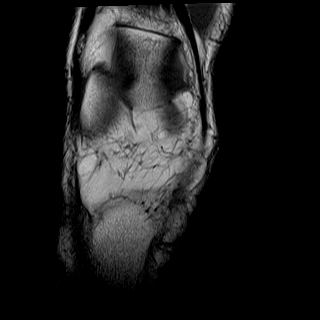
[im 9/22]
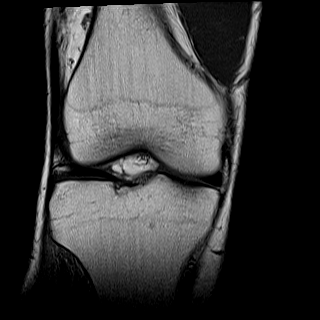
[im 13/22]
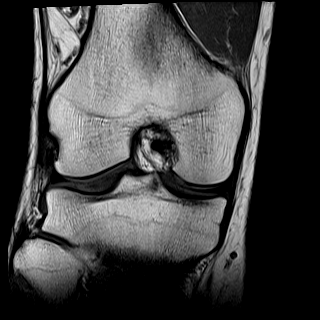
[im 17/22]
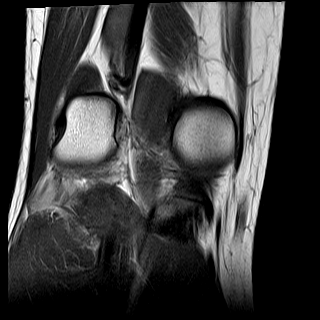
[im 22/22]
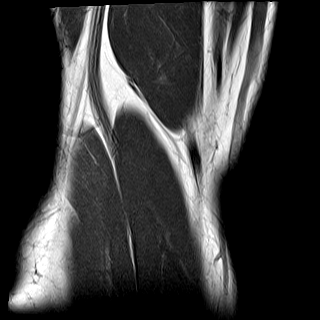

[Series 7: t2_cor_fs · coronal · 4.0mm · 0.44mm/px · 6 of 22 slices shown]
[im 1/22]
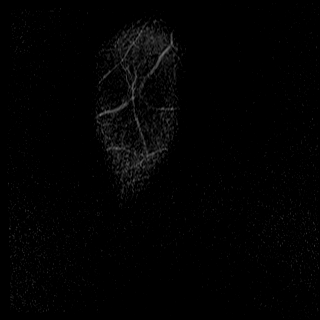
[im 5/22]
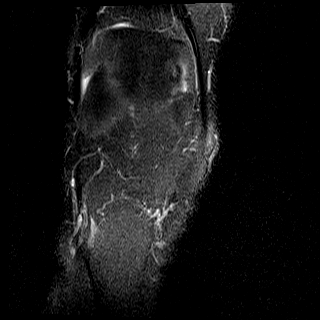
[im 9/22]
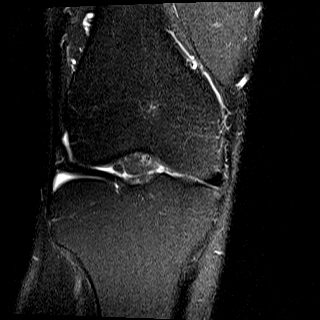
[im 13/22]
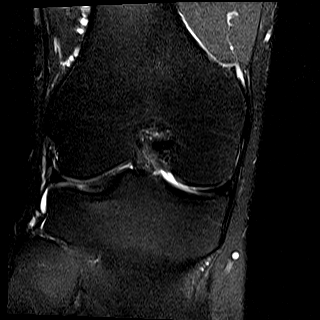
[im 17/22]
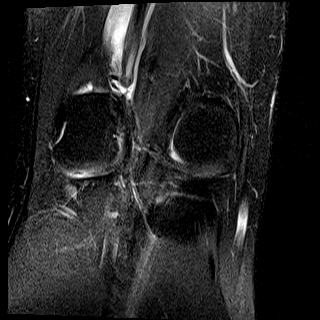
[im 22/22]
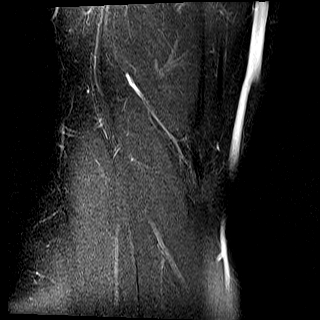

[33 of 40 positions shown; findings below may reference images not displayed]

FINDINGS: OSSEOUS: No acute fracture, avascular necrosis or aggressive osseous lesion.
MEDIAL COMPARTMENT:
Medial meniscus: Partial-thickness tear of the posterior root insertion of the medial meniscus, with associated parameniscal cysts. Redemonstration of horizontal tearing of the peripheral aspect of
the posterior horn, extending into the posterior body. No displaced meniscal flap. No extrusion of the medial meniscal body.
Articular cartilage: Mild chondral irregularity of the medial femoral condyle articular cartilage, similar to prior examination. No focal full-thickness chondral defect or underlying reactive osseous
change.
LATERAL COMPARTMENT:
Lateral meniscus: Intact.
Articular cartilage: Superficial partial-thickness chondral irregularity of the lateral tibial plateau. No underlying reactive osseous change.
PATELLOFEMORAL JOINT AND EXTENSOR MECHANISM:
Quadriceps tendon: The visualized portion of the distal quadriceps tendon is intact.
Patella tendon: Intact.
Articular cartilage: Deep partial-thickness chondral fissure of the medial patellar with associated 4 mm region of chondral delamination (series 2, image 5), new from prior examination. No underlying
reactive osseous change. Full-thickness chondral fissure of the trochlear groove with development of moderate focal subchondral marrow edema.
Alignment: The alignment of the patellofemoral joint is within normal limits, in the imaged position.
LIGAMENTS:
Anterior cruciate ligament: Intact.
Posterior cruciate ligament: Intact.
Medial collateral ligament: Intact.
Lateral collateral ligament: Intact.
MUSCULOTENDINOUS: The visualized musculotendinous soft tissues about the knee are unremarkable.
OTHER: Small volume knee joint fluid. Interval resolution of the multiloculated cystic structure in Hoffa's fat.
IMPRESSION: 1.  Partial-thickness tear of the posterior root insertion of the medial meniscus, with associated parameniscal cyst. Redemonstration of horizontal tearing of the posterior horn extending into the
posterior body of the medial meniscus. Mild medial compartment chondrosis, unchanged.
2.  Interval progression of mild to moderate patellofemoral joint chondrosis.
3.  Interval resolution of multiloculated cystic structure in Hoffa's fat.

## 2021-12-28 IMAGING — RF ARTHROGRAM HIP RT
3 series · 3 of 3 positions shown · non-contrast
Comparison: MRI right hip 12/28/21
PROCEDURE:
After discussion of the risks and benefits of the procedure, including bleeding and infection, informed consent was obtained.

Images Obtained from [facility]
INDICATION: Other articular cartilage disorders, right hip

[Series 1: AP · right · 0.15mm/px · 1 of 1 slices shown]
[im 1/1]
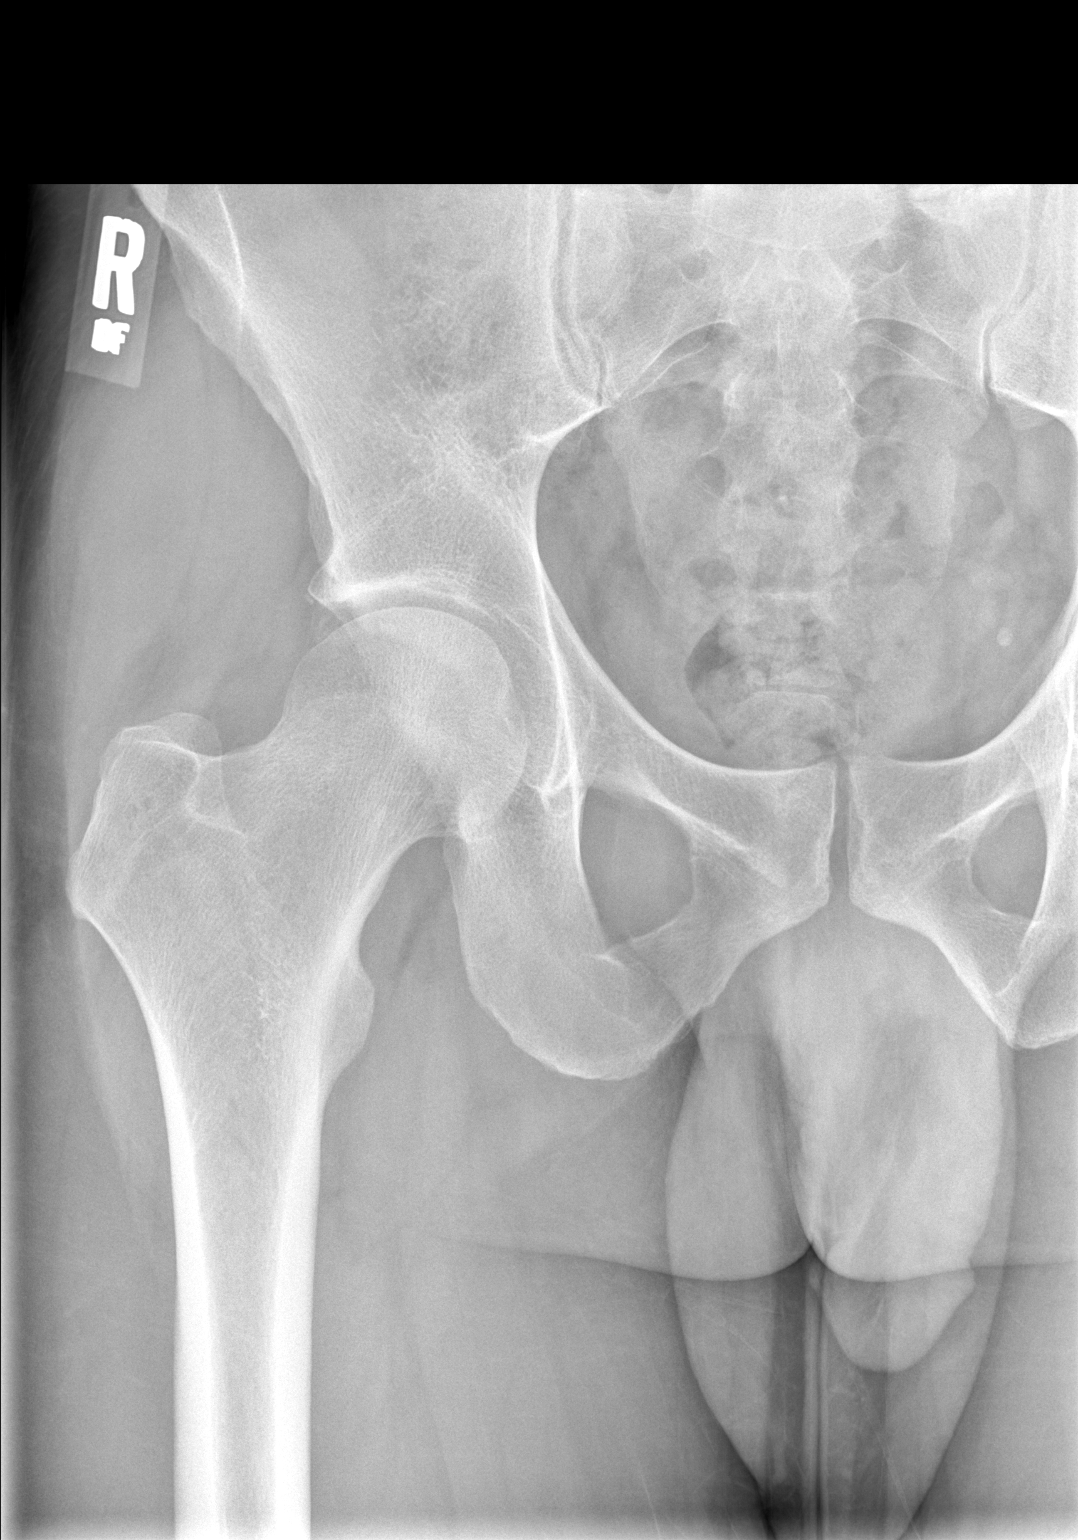

[Series 2: rlo · right · 0.15mm/px · 1 of 1 slices shown]
[im 1/1]
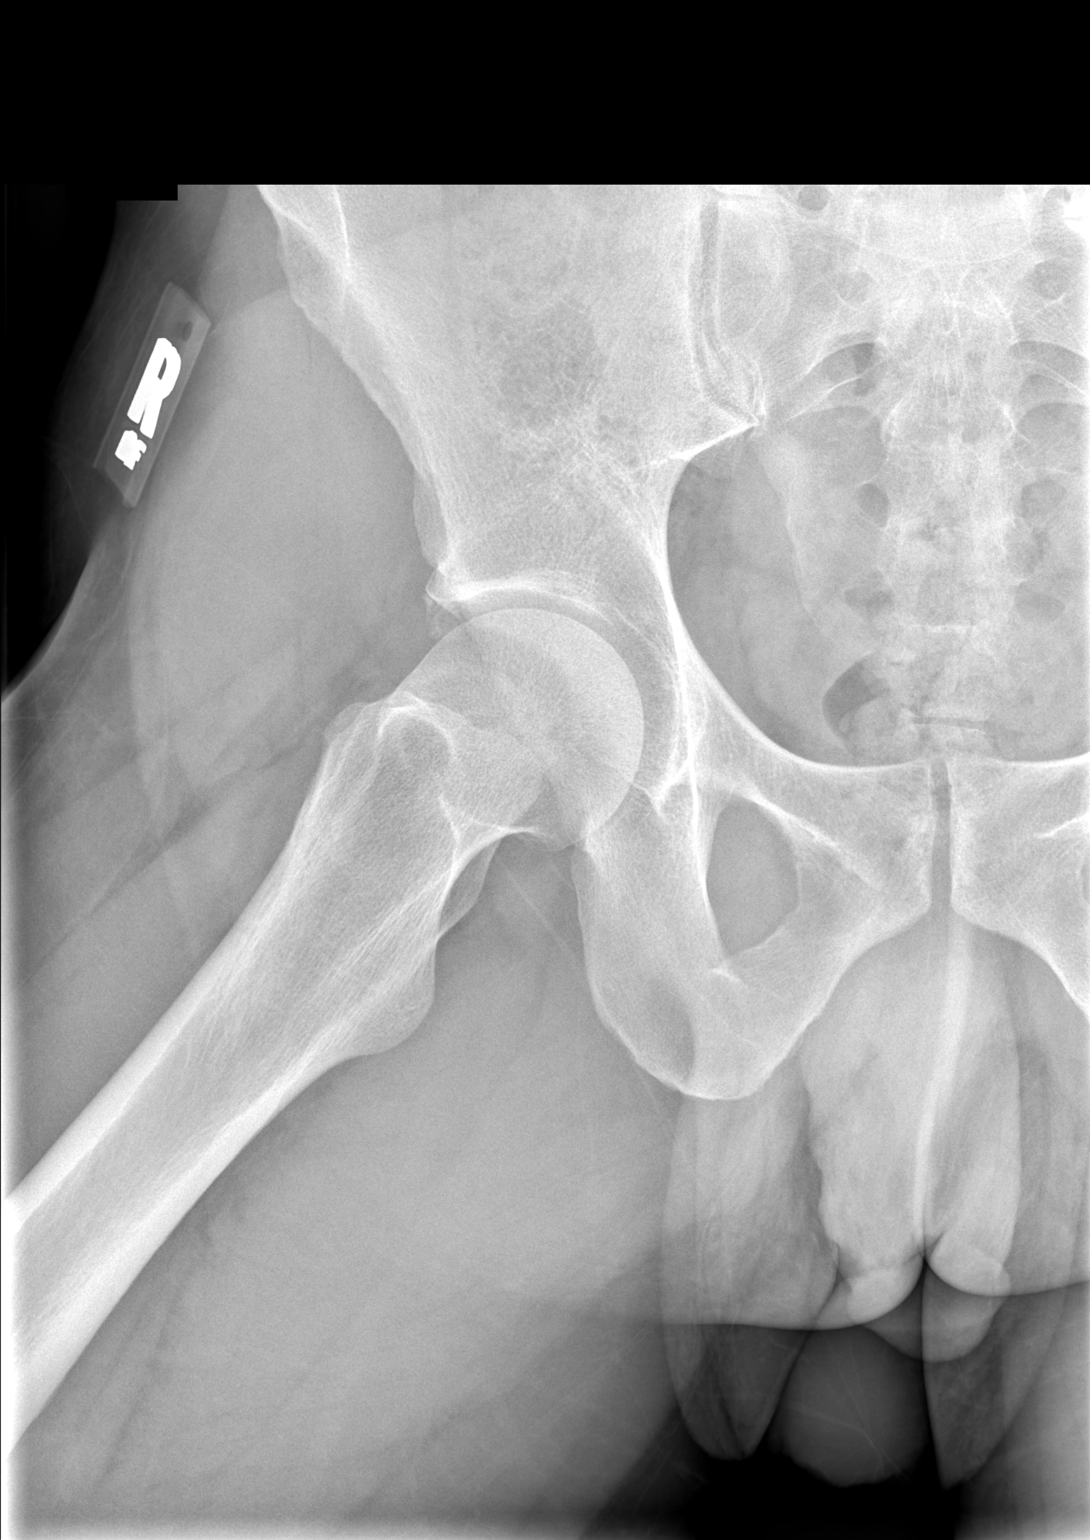

[Series 3: cp_standard · 0.17mm/px · 1 of 1 slices shown]
[im 1/1]
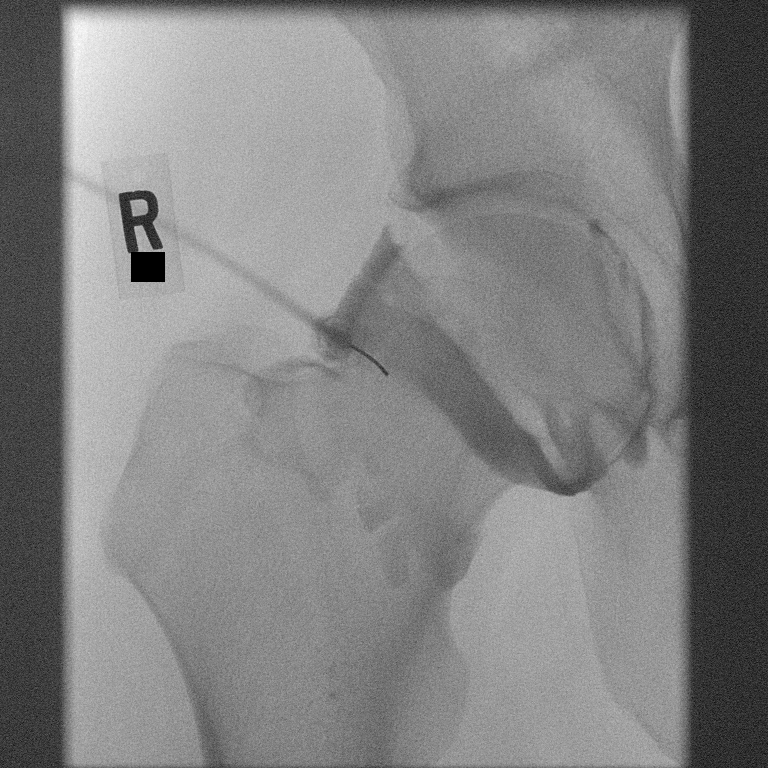

[3 of 3 positions shown; findings below may reference images not displayed]

The patient placed supine on the fluoroscopy table. Timeout was
performed. Skin entry site over the right hip was then marked under fluoroscopic guidance. The patient was then prepped and draped in the usual sterile fashion. Local anesthesia was achieved using a
small amount of 1% lidocaine. A 22-gauge hypodermic needle was then advanced into the right hip under fluoroscopic guidance. A small amount of radiographic contrast was injected which confirmed
intra-articular positioning. Then approximately 12 mL of a solution containing 10 mL saline, 5 mL lidocaine, 5 mL Omnipaque 240 and 0.1 mL ProHance was injected intra-articularly. The needle was
removed. The patient tolerated the procedure well without immediate complication.
RADIATION DOSE:
Number of fluoroscopic images:  3
Fluoroscopy time: 26 seconds
Dose area product: 0.181 mGy*m2
Reference air kerma: 9.90 mGy
IMPRESSION: Successful fluoroscopic-guided intra-articular administration of contrast into the right hip for MRI arthrogram.
